# Patient Record
Sex: Male | Born: 2000 | Race: White | Hispanic: No | Marital: Single | Smoking: Current every day smoker
Health system: Southern US, Community
[De-identification: ages and names within clinical notes are randomized; demographics above are authoritative.]

## PROBLEM LIST (undated history)

## (undated) DIAGNOSIS — J45909 Unspecified asthma, uncomplicated: Secondary | ICD-10-CM

## (undated) DIAGNOSIS — Z9109 Other allergy status, other than to drugs and biological substances: Secondary | ICD-10-CM

## (undated) DIAGNOSIS — F419 Anxiety disorder, unspecified: Secondary | ICD-10-CM

## (undated) DIAGNOSIS — T7840XA Allergy, unspecified, initial encounter: Secondary | ICD-10-CM

## (undated) HISTORY — DX: Allergy, unspecified, initial encounter: T78.40XA

## (undated) HISTORY — DX: Unspecified asthma, uncomplicated: J45.909

## (undated) HISTORY — DX: Anxiety disorder, unspecified: F41.9

## (undated) HISTORY — PX: ADENOIDECTOMY: SUR15

## (undated) HISTORY — PX: TONSILLECTOMY: SUR1361

---
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
## 2019-04-02 DIAGNOSIS — U071 COVID-19: Secondary | ICD-10-CM | POA: Diagnosis not present

## 2019-04-02 DIAGNOSIS — Z20828 Contact with and (suspected) exposure to other viral communicable diseases: Secondary | ICD-10-CM | POA: Diagnosis not present

## 2019-08-10 DIAGNOSIS — Z23 Encounter for immunization: Secondary | ICD-10-CM | POA: Diagnosis not present

## 2019-08-15 DIAGNOSIS — R002 Palpitations: Secondary | ICD-10-CM | POA: Diagnosis not present

## 2019-10-04 ENCOUNTER — Ambulatory Visit: Payer: 59 | Admitting: Medical

## 2019-10-06 DIAGNOSIS — Z0000 Encounter for general adult medical examination without abnormal findings: Secondary | ICD-10-CM | POA: Diagnosis not present

## 2019-12-28 DIAGNOSIS — Z23 Encounter for immunization: Secondary | ICD-10-CM | POA: Diagnosis not present

## 2020-03-29 DIAGNOSIS — Z20822 Contact with and (suspected) exposure to covid-19: Secondary | ICD-10-CM | POA: Diagnosis not present

## 2020-03-29 DIAGNOSIS — R0981 Nasal congestion: Secondary | ICD-10-CM | POA: Diagnosis not present

## 2020-03-29 DIAGNOSIS — J069 Acute upper respiratory infection, unspecified: Secondary | ICD-10-CM | POA: Diagnosis not present

## 2020-05-05 DIAGNOSIS — R519 Headache, unspecified: Secondary | ICD-10-CM | POA: Diagnosis not present

## 2020-05-05 DIAGNOSIS — Z20828 Contact with and (suspected) exposure to other viral communicable diseases: Secondary | ICD-10-CM | POA: Diagnosis not present

## 2020-08-29 DIAGNOSIS — F4011 Social phobia, generalized: Secondary | ICD-10-CM | POA: Diagnosis not present

## 2020-09-04 DIAGNOSIS — L209 Atopic dermatitis, unspecified: Secondary | ICD-10-CM | POA: Diagnosis not present

## 2020-09-04 DIAGNOSIS — L219 Seborrheic dermatitis, unspecified: Secondary | ICD-10-CM | POA: Diagnosis not present

## 2020-09-19 DIAGNOSIS — F4011 Social phobia, generalized: Secondary | ICD-10-CM | POA: Diagnosis not present

## 2020-10-10 DIAGNOSIS — F4011 Social phobia, generalized: Secondary | ICD-10-CM | POA: Diagnosis not present

## 2020-10-31 DIAGNOSIS — F4011 Social phobia, generalized: Secondary | ICD-10-CM | POA: Diagnosis not present

## 2020-11-15 DIAGNOSIS — F4011 Social phobia, generalized: Secondary | ICD-10-CM | POA: Diagnosis not present

## 2020-12-07 DIAGNOSIS — F4011 Social phobia, generalized: Secondary | ICD-10-CM | POA: Diagnosis not present

## 2020-12-25 DIAGNOSIS — F4011 Social phobia, generalized: Secondary | ICD-10-CM | POA: Diagnosis not present

## 2020-12-26 DIAGNOSIS — Z23 Encounter for immunization: Secondary | ICD-10-CM | POA: Diagnosis not present

## 2021-01-24 DIAGNOSIS — F4011 Social phobia, generalized: Secondary | ICD-10-CM | POA: Diagnosis not present

## 2021-02-14 DIAGNOSIS — F4011 Social phobia, generalized: Secondary | ICD-10-CM | POA: Diagnosis not present

## 2021-03-07 DIAGNOSIS — F4011 Social phobia, generalized: Secondary | ICD-10-CM | POA: Diagnosis not present

## 2021-10-11 DIAGNOSIS — D229 Melanocytic nevi, unspecified: Secondary | ICD-10-CM | POA: Diagnosis not present

## 2021-10-11 DIAGNOSIS — Z720 Tobacco use: Secondary | ICD-10-CM | POA: Diagnosis not present

## 2021-10-11 DIAGNOSIS — E6609 Other obesity due to excess calories: Secondary | ICD-10-CM | POA: Diagnosis not present

## 2021-10-11 DIAGNOSIS — Z13220 Encounter for screening for lipoid disorders: Secondary | ICD-10-CM | POA: Diagnosis not present

## 2021-10-11 DIAGNOSIS — J310 Chronic rhinitis: Secondary | ICD-10-CM | POA: Diagnosis not present

## 2021-10-11 DIAGNOSIS — Z13228 Encounter for screening for other metabolic disorders: Secondary | ICD-10-CM | POA: Diagnosis not present

## 2021-10-11 DIAGNOSIS — Z130 Encounter for screening for diseases of the blood and blood-forming organs and certain disorders involving the immune mechanism: Secondary | ICD-10-CM | POA: Diagnosis not present

## 2021-10-11 DIAGNOSIS — Z6830 Body mass index (BMI) 30.0-30.9, adult: Secondary | ICD-10-CM | POA: Diagnosis not present

## 2021-10-11 DIAGNOSIS — Z0000 Encounter for general adult medical examination without abnormal findings: Secondary | ICD-10-CM | POA: Diagnosis not present

## 2021-10-11 DIAGNOSIS — Z23 Encounter for immunization: Secondary | ICD-10-CM | POA: Diagnosis not present

## 2021-12-24 DIAGNOSIS — Z23 Encounter for immunization: Secondary | ICD-10-CM | POA: Diagnosis not present

## 2022-02-20 DIAGNOSIS — D225 Melanocytic nevi of trunk: Secondary | ICD-10-CM | POA: Diagnosis not present

## 2022-11-27 DIAGNOSIS — E6609 Other obesity due to excess calories: Secondary | ICD-10-CM | POA: Diagnosis not present

## 2022-11-27 DIAGNOSIS — Z0000 Encounter for general adult medical examination without abnormal findings: Secondary | ICD-10-CM | POA: Diagnosis not present

## 2022-11-27 DIAGNOSIS — Z6831 Body mass index (BMI) 31.0-31.9, adult: Secondary | ICD-10-CM | POA: Diagnosis not present

## 2022-11-27 DIAGNOSIS — J310 Chronic rhinitis: Secondary | ICD-10-CM | POA: Diagnosis not present

## 2022-12-23 DIAGNOSIS — Z23 Encounter for immunization: Secondary | ICD-10-CM | POA: Diagnosis not present

## 2023-06-12 ENCOUNTER — Ambulatory Visit (HOSPITAL_BASED_OUTPATIENT_CLINIC_OR_DEPARTMENT_OTHER)
Admission: EM | Admit: 2023-06-12 | Discharge: 2023-06-12 | Disposition: A | Discharge status: Home / Self Care | Attending: Family Medicine | Admitting: Family Medicine

## 2023-06-12 ENCOUNTER — Encounter (HOSPITAL_BASED_OUTPATIENT_CLINIC_OR_DEPARTMENT_OTHER): Payer: Self-pay | Admitting: *Deleted

## 2023-06-12 ENCOUNTER — Ambulatory Visit (HOSPITAL_BASED_OUTPATIENT_CLINIC_OR_DEPARTMENT_OTHER): Admitting: Radiology

## 2023-06-12 ENCOUNTER — Ambulatory Visit: Payer: Self-pay

## 2023-06-12 ENCOUNTER — Other Ambulatory Visit (HOSPITAL_BASED_OUTPATIENT_CLINIC_OR_DEPARTMENT_OTHER): Payer: Self-pay

## 2023-06-12 DIAGNOSIS — R0602 Shortness of breath: Secondary | ICD-10-CM

## 2023-06-12 DIAGNOSIS — R0789 Other chest pain: Secondary | ICD-10-CM

## 2023-06-12 MED ORDER — ALBUTEROL SULFATE HFA 108 (90 BASE) MCG/ACT IN AERS
1.0000 | INHALATION_SPRAY | Freq: Four times a day (QID) | RESPIRATORY_TRACT | 1 refills | Status: DC | PRN
  Filled 2023-06-12: qty 6.7, 25d supply, fill #0

## 2023-06-12 NOTE — Telephone Encounter (Signed)
 Patient has been scheduled for a new patient appointment at [facility] per Mom's request. Patient was already triaged. Copied from CRM [id number]. Topic: Clinical - Red Word Triage >> Jun 12, 2023 10:20 AM Antwanette L wrote: Red Word that prompted transfer to Nurse Triage: hard to breathe and chest tightness

## 2023-06-12 NOTE — Telephone Encounter (Signed)
 Copied from CRM [id number]. Topic: Clinical - Red Word Triage >> Jun 12, 2023  8:05 AM Elle L wrote: Red Word that prompted transfer to Nurse Triage: The patient's Mom, Woody Kronberg, was calling to schedule the patient at [facility]. However, she advised me that the patient is asthmatic and has been having tightness in his chest and difficulty breathing and he does not have an inhaler.  No acute needs at this time. Would like to establish with [facility] clinic.  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  Office to follow up.

## 2023-06-12 NOTE — ED Provider Notes (Signed)
 [facility]    CSN: [id number] Arrival date & time: 06/12/23  1124      History   Chief Complaint Chief Complaint  Patient presents with   Shortness of Breath    HPI Jonathon Kramer is a 23 y.o. male.   Patient is a 23 year old male that presents today with shortness of breath, chest tightness for about a week.  Reports waxing and waning.  Some mild congestion.  No fevers or chills.  Reports feels similar to when he had asthma attacks as a kid.  He has been taking his allergy medication.  Does have severe allergies.   Shortness of Breath   Past Medical History:  Diagnosis Date   Environmental allergies     There are no active problems to display for this patient.   Past Surgical History:  Procedure Laterality Date   ADENOIDECTOMY     TONSILLECTOMY         Home Medications    Prior to Admission medications   Medication Sig Start Date End Date Taking? Authorizing Provider  albuterol (VENTOLIN HFA) 108 (90 Base) MCG/ACT inhaler Inhale 1-2 puffs into the lungs every 6 (six) hours as needed for wheezing or shortness of breath. 06/12/23  Yes Liylah Najarro A, FNP  Cetirizine HCl (ZYRTEC ALLERGY PO) Take 10 mg by mouth daily.    [provider]    Family History Family History  Problem Relation Age of Onset   Eating disorder Brother     Social History Social History   Tobacco Use   Smoking status: Never  Vaping Use   Vaping status: Every Day  Substance Use Topics   Alcohol use: No   Drug use: No     Allergies   Patient has no known allergies.   Review of Systems Review of Systems  Respiratory:  Positive for chest tightness and shortness of breath.      Physical Exam Triage Vital Signs ED Triage Vitals  Encounter Vitals Group     BP 06/12/23 1204 131/86     Systolic BP Percentile --      Diastolic BP Percentile --      Pulse Rate 06/12/23 1204 69     Resp 06/12/23 1204 18     Temp 06/12/23 1204 98.2 F (36.8 C)     Temp  Source 06/12/23 1204 Oral     SpO2 06/12/23 1204 95 %     Weight 06/12/23 1202 210 lb (95.3 kg)     Height 06/12/23 1202 5\' 10"  (1.778 m)     Head Circumference --      Peak Flow --      Pain Score 06/12/23 1202 0     Pain Loc --      Pain Education --      Exclude from Growth Chart --    No data found.  Updated Vital Signs BP 131/86 (BP Location: Right Arm)   Pulse 69   Temp 98.2 F (36.8 C) (Oral)   Resp 18   Ht 5\' 10"  (1.778 m)   Wt 210 lb (95.3 kg)   SpO2 95%   BMI 30.13 kg/m   Visual Acuity Right Eye Distance:   Left Eye Distance:   Bilateral Distance:    Right Eye Near:   Left Eye Near:    Bilateral Near:     Physical Exam Constitutional:      General: He is not in acute distress.    Appearance: He is well-developed.  He is not ill-appearing, toxic-appearing or diaphoretic.  HENT:     Mouth/Throat:     Pharynx: Oropharynx is clear.  Eyes:     Conjunctiva/sclera: Conjunctivae normal.  Cardiovascular:     Rate and Rhythm: Normal rate and regular rhythm.     Heart sounds: Normal heart sounds.  Pulmonary:     Effort: Pulmonary effort is normal.     Breath sounds: No decreased breath sounds.  Musculoskeletal:        General: No swelling, deformity or signs of injury. Normal range of motion.  Skin:    General: Skin is warm and dry.  Neurological:     Mental Status: He is alert.  Psychiatric:        Mood and Affect: Mood normal.      UC Treatments / Results  Labs (all labs ordered are listed, but only abnormal results are displayed) Labs Reviewed - No data to display  EKG   Radiology DG Chest 2 View Result Date: 06/12/2023 CLINICAL DATA:  Chest tightness and shortness of breath for the past week. EXAM: CHEST - 2 VIEW COMPARISON:  Chest x-ray dated January 04, 2012. FINDINGS: The heart size and mediastinal contours are within normal limits. Both lungs are clear. The visualized skeletal structures are unremarkable. IMPRESSION: No active  cardiopulmonary disease. Electronically Signed   By: Obie Dredge M.D.   On: 06/12/2023 13:28    Procedures Procedures (including critical care time)  Medications Ordered in UC Medications - No data to display  Initial Impression / Assessment and Plan / UC Course  I have reviewed the triage vital signs and the nursing notes.  Pertinent labs & imaging results that were available during my care of the patient were reviewed by me and considered in my medical decision making (see chart for details).     Shortness of breath-believe may be due to allergies.  Chest x-ray was completely normal.  No other concerns on exam. Gave albuterol inhaler to use as needed for cough, wheezing or shortness of breath. Recommended if symptoms continue to follow-up with his doctor. Final Clinical Impressions(s) / UC Diagnoses   Final diagnoses:  Shortness of breath     Discharge Instructions      Continue your allergy medication. I am giving you an inhaler to use for cough, wheezing shortness of breath.  Follow up with your doctor as needed     ED Prescriptions     Medication Sig Dispense Auth. Provider   albuterol (VENTOLIN HFA) 108 (90 Base) MCG/ACT inhaler Inhale 1-2 puffs into the lungs every 6 (six) hours as needed for wheezing or shortness of breath. 6.7 g Janace Aris, FNP      PDMP not reviewed this encounter.   Janace Aris, FNP 06/12/23 1534

## 2023-06-12 NOTE — ED Triage Notes (Signed)
 Patient states shortness of breath/chest tightness for about 1 week without other symptoms.

## 2023-06-12 NOTE — Discharge Instructions (Signed)
 Continue your allergy medication. I am giving you an inhaler to use for cough, wheezing shortness of breath.  Follow up with your doctor as needed

## 2023-06-18 ENCOUNTER — Encounter (HOSPITAL_BASED_OUTPATIENT_CLINIC_OR_DEPARTMENT_OTHER): Payer: Self-pay | Admitting: Student

## 2023-06-18 ENCOUNTER — Ambulatory Visit (HOSPITAL_BASED_OUTPATIENT_CLINIC_OR_DEPARTMENT_OTHER): Admitting: Student

## 2023-06-18 VITALS — BP 134/77 | HR 79 | Temp 97.8°F | Resp 16 | Ht 68.9 in | Wt 218.0 lb

## 2023-06-18 DIAGNOSIS — Z6832 Body mass index (BMI) 32.0-32.9, adult: Secondary | ICD-10-CM

## 2023-06-18 DIAGNOSIS — Z7689 Persons encountering health services in other specified circumstances: Secondary | ICD-10-CM

## 2023-06-18 DIAGNOSIS — J4530 Mild persistent asthma, uncomplicated: Secondary | ICD-10-CM | POA: Insufficient documentation

## 2023-06-18 DIAGNOSIS — J302 Other seasonal allergic rhinitis: Secondary | ICD-10-CM | POA: Insufficient documentation

## 2023-06-18 DIAGNOSIS — L309 Dermatitis, unspecified: Secondary | ICD-10-CM | POA: Insufficient documentation

## 2023-06-18 DIAGNOSIS — E6609 Other obesity due to excess calories: Secondary | ICD-10-CM

## 2023-06-18 DIAGNOSIS — Z720 Tobacco use: Secondary | ICD-10-CM | POA: Insufficient documentation

## 2023-06-18 DIAGNOSIS — E66811 Obesity, class 1: Secondary | ICD-10-CM | POA: Insufficient documentation

## 2023-06-18 NOTE — Assessment & Plan Note (Addendum)
 Stable. Severe seasonal allergies managed with daily Zyrtec. Symptoms improve with Zyrtec, no preference for switching antihistamines. - Continue daily Zyrtec

## 2023-06-18 NOTE — Assessment & Plan Note (Addendum)
 Stable. Intermittent shortness of breath and chest tightness last week, likely exacerbated by seasonal allergies. Symptoms improved with albuterol inhaler. No current wheezing or chest tightness. Lung exam normal, chest X-ray clear. No need for maintenance inhaler unless symptoms persist outside of spring. - Continue albuterol inhaler as needed - Consider daily inhaler if symptoms persist outside of spring - May consider Montelukast at future visit.

## 2023-06-18 NOTE — Assessment & Plan Note (Signed)
 Daily vaping habit acknowledged. Desires to quit but does not feel it impacts current respiratory symptoms. Discussed long-term risks of vaping, focusing on potential future health impacts. - Discuss smoking cessation options if interest in quitting is expressed - Educated. Does not desire to quit today.

## 2023-06-18 NOTE — Assessment & Plan Note (Addendum)
 No current plaques noted on examination. Continue to follow with dermatology. Stable- Continue prn topical cream as needed.

## 2023-06-18 NOTE — Patient Instructions (Signed)
 It was nice to see you today!  As we discussed in clinic please continue your albuterol inhaler as needed. If you are needing it several times per day for the next couple of months, I would encourage you to let me know so that we can get you on a maintenance inhaler.  If you have any problems before your next visit feel free to message me via MyChart (minor issues or questions) or call the office, otherwise you may reach out to schedule an office visit.  Thank you! Gerilyn Pilgrim Cephas Revard, PA-C

## 2023-06-18 NOTE — Progress Notes (Signed)
 New Patient Office Visit  Subjective    Patient ID: Jonathon Kramer, male    DOB: 10/08/2000  Age: 23 y.o. MRN: [id number]  CC:  Chief Complaint  Patient presents with   Establish Care    Here to establish care.   Shortness of Breath    Had to go to UC last week and was having trouble breathing. Has hx of asthma, was more prevalent as a child. Last week had similar sxs.    Discussed the use of AI scribe software for clinical note transcription with the patient, who gave verbal consent to proceed.  History of Present Illness   Jonathon Kramer is a 23 year old male with asthma and severe allergies who presents with shortness of breath.  He has experienced shortness of breath for the past week, accompanied by a sensation of chest tightness that worsened over time, affecting his sleep, particularly upon waking. The symptoms have improved with the use of an albuterol inhaler as prescribed, and he no longer feels chest pain this week. No current wheezing is noted, but he mentions a reduced lung capacity that has improved. A recent chest X-ray was clear.  I have reviewed the real images of the chest xray from 06/12/23 and agree with the interpretation. No cardiopulmonary disease.  He has a history of severe allergies, particularly to seasonal changes, and takes Zyrtec daily, which he finds effective. He notes an immediate difference if he misses a dose and suspects his recent symptoms may be allergy-related, similar to past experiences with asthma during childhood.  He has a history of eczema, primarily as a child, with occasional facial outbreaks managed with a topical cream. No recent eczema flare-ups except for occasional facial outbreaks. Follows with Dermatology in [place].  He vapes daily but does not believe it impacts his breathing. He wants to quit vaping eventually but has not made any attempts yet.  His family history is notable for his mother having a heart condition, possibly heart  failure, and a heart attack in her late thirties or early forties. Discussed possible screening exams in the future to assure cardiovascular health.        06/18/2023    1:13 PM  GAD 7 : Generalized Anxiety Score  Nervous, Anxious, on Edge 1  Control/stop worrying 1  Worry too much - different things 2  Trouble relaxing 1  Restless 0  Easily annoyed or irritable 1  Afraid - awful might happen 0  Total GAD 7 Score 6  Anxiety Difficulty Not difficult at all      06/18/2023    1:13 PM  PHQ9 SCORE ONLY  PHQ-9 Total Score 11   Screenings:  Colon Cancer: not due Lung Cancer: vapes nicotine. Denies help with quitting. Diabetes: no pmh  HLD: no pmh  Hep B Vax: done  Outpatient Encounter Medications as of 06/18/2023  Medication Sig   albuterol (VENTOLIN HFA) 108 (90 Base) MCG/ACT inhaler Inhale 1-2 puffs into the lungs every 6 (six) hours as needed for wheezing or shortness of breath.   Cetirizine HCl (ZYRTEC ALLERGY PO) Take 10 mg by mouth daily.   No facility-administered encounter medications on file as of 06/18/2023.    Past Medical History:  Diagnosis Date   Asthma    Environmental allergies     Past Surgical History:  Procedure Laterality Date   ADENOIDECTOMY     TONSILLECTOMY      Family History  Problem Relation Age of Onset  Heart disease Mother        heart condition    Social History   Socioeconomic History   Marital status: Single    Spouse name: Not on file   Number of children: 0   Years of education: Not on file   Highest education level: Not on file  Occupational History   Not on file  Tobacco Use   Smoking status: Never    Passive exposure: Never   Smokeless tobacco: Never  Vaping Use   Vaping status: Every Day   Substances: Nicotine  Substance and Sexual Activity   Alcohol use: Yes    Comment: occasionally   Drug use: No   Sexual activity: Not on file  Other Topics Concern   Not on file  Social History Narrative   Not on file    Social Drivers of Health   Financial Resource Strain: Not on file  Food Insecurity: No Food Insecurity (06/18/2023)   Hunger Vital Sign    Worried About Running Out of Food in the Last Year: Never true    Ran Out of Food in the Last Year: Never true  Transportation Needs: No Transportation Needs (06/18/2023)   PRAPARE - Administrator, Civil Service (Medical): No    Lack of Transportation (Non-Medical): No  Physical Activity: Not on file  Stress: Not on file  Social Connections: Not on file  Intimate Partner Violence: Not At Risk (06/18/2023)   Humiliation, Afraid, Rape, and Kick questionnaire    Fear of Current or Ex-Partner: No    Emotionally Abused: No    Physically Abused: No    Sexually Abused: No    ROS  Per HPI      Objective    BP 134/77   Pulse 79   Temp 97.8 F (36.6 C) (Oral)   Resp 16   Ht 5' 8.9" (1.75 m)   Wt 218 lb (98.9 kg)   SpO2 97%   BMI 32.29 kg/m   Physical Exam Constitutional:      General: He is not in acute distress.    Appearance: Normal appearance. He is not ill-appearing.  HENT:     Head: Normocephalic and atraumatic.     Right Ear: External ear normal.     Left Ear: External ear normal.     Nose: Nose normal.     Mouth/Throat:     Mouth: Mucous membranes are moist.     Pharynx: Oropharynx is clear.  Eyes:     General: No scleral icterus.    Extraocular Movements: Extraocular movements intact.     Conjunctiva/sclera: Conjunctivae normal.     Pupils: Pupils are equal, round, and reactive to light.  Neck:     Vascular: No carotid bruit.  Cardiovascular:     Rate and Rhythm: Normal rate and regular rhythm.     Pulses: Normal pulses.     Heart sounds: Normal heart sounds. No murmur heard.    No friction rub.  Pulmonary:     Effort: Pulmonary effort is normal. No respiratory distress.     Breath sounds: Normal breath sounds. No wheezing, rhonchi or rales.  Musculoskeletal:        General: Normal range of motion.      Cervical back: Neck supple.     Right lower leg: No edema.     Left lower leg: No edema.  Skin:    General: Skin is warm and dry.     Coloration: Skin is not  jaundiced or pale.  Neurological:     General: No focal deficit present.     Mental Status: He is alert.  Psychiatric:        Mood and Affect: Mood normal.        Behavior: Behavior normal.         Assessment & Plan:   Encounter to establish care  Vapes nicotine containing substance Assessment & Plan: Daily vaping habit acknowledged. Desires to quit but does not feel it impacts current respiratory symptoms. Discussed long-term risks of vaping, focusing on potential future health impacts. - Discuss smoking cessation options if interest in quitting is expressed - Educated. Does not desire to quit today.   Mild persistent asthma without complication Assessment & Plan: Intermittent shortness of breath and chest tightness last week, likely exacerbated by seasonal allergies. Symptoms improved with albuterol inhaler. No current wheezing or chest tightness. Lung exam normal, chest X-ray clear. No need for maintenance inhaler unless symptoms persist outside of spring. - Continue albuterol inhaler as needed - Consider daily inhaler if symptoms persist outside of spring - May consider Montelukast at future visit.   Class 1 obesity due to excess calories without serious comorbidity with body mass index (BMI) of 32.0 to 32.9 in adult Assessment & Plan: BMI today of 32.29. Currently a full time student which may contribute.   Seasonal allergies Assessment & Plan: Severe seasonal allergies managed with daily Zyrtec. Symptoms improve with Zyrtec, no preference for switching antihistamines. - Continue daily Zyrtec   Eczema, unspecified type Assessment & Plan: No current plaques noted on examination. Continue to follow with dermatology. Continue prn topical cream as needed.   General Health Maintenance No diabetes or  hyperlipidemia. Previous physical exam in August 2024 without blood work. Family history of early heart disease noted. Discussed potential future screenings (in the next 15-20 years) for heart health, including LP(a), ApoB, and coronary artery calcium scan, given family history of early heart attack. - Schedule next physical exam for August 2025 with blood work - Consider future cholesterol screenings and coronary artery calcium scan (consider in age 85s) based on family history     I spent 45 minutes reviewing charts from multiple sources and specialists, review of recent labs and imaging studies, discussing both acute and chronic conditions with patient, and making changes to patients prescription management. Has seen urgent care- is technically an established patient.  Return in about 5 months (around 11/03/2023) for Annual Physical.   Teryl Lucy Lorie Cleckley, PA-C

## 2023-06-18 NOTE — Assessment & Plan Note (Signed)
 BMI today of 32.29. Currently a full time student which may contribute.

## 2023-11-03 ENCOUNTER — Encounter (HOSPITAL_BASED_OUTPATIENT_CLINIC_OR_DEPARTMENT_OTHER): Payer: Self-pay | Admitting: Student

## 2023-11-07 DIAGNOSIS — D225 Melanocytic nevi of trunk: Secondary | ICD-10-CM | POA: Diagnosis not present

## 2023-11-28 ENCOUNTER — Encounter (HOSPITAL_BASED_OUTPATIENT_CLINIC_OR_DEPARTMENT_OTHER): Payer: Self-pay | Admitting: Emergency Medicine

## 2023-11-28 ENCOUNTER — Ambulatory Visit (HOSPITAL_BASED_OUTPATIENT_CLINIC_OR_DEPARTMENT_OTHER)
Admission: EM | Admit: 2023-11-28 | Discharge: 2023-11-28 | Disposition: A | Discharge status: Home / Self Care | Attending: Family Medicine | Admitting: Family Medicine

## 2023-11-28 ENCOUNTER — Other Ambulatory Visit (HOSPITAL_BASED_OUTPATIENT_CLINIC_OR_DEPARTMENT_OTHER): Payer: Self-pay

## 2023-11-28 DIAGNOSIS — H6123 Impacted cerumen, bilateral: Secondary | ICD-10-CM | POA: Diagnosis not present

## 2023-11-28 DIAGNOSIS — H6122 Impacted cerumen, left ear: Secondary | ICD-10-CM

## 2023-11-28 DIAGNOSIS — H66002 Acute suppurative otitis media without spontaneous rupture of ear drum, left ear: Secondary | ICD-10-CM | POA: Diagnosis not present

## 2023-11-28 MED ORDER — CEFDINIR 300 MG PO CAPS
300.0000 mg | ORAL_CAPSULE | Freq: Two times a day (BID) | ORAL | 0 refills | Status: AC
  Filled 2023-11-28: qty 20, 10d supply, fill #0

## 2023-11-28 NOTE — ED Triage Notes (Signed)
 Pt c/o left ear fullness on and off for about 3 days and he can barely hear anything out of it this morning. Pt has a history of wax build up in the past.

## 2023-11-28 NOTE — ED Provider Notes (Signed)
 [facility]    CSN: [id number] Arrival date & time: 11/28/23  1013      History   Chief Complaint Chief Complaint  Patient presents with   Ear Fullness    HPI Jonathon Kramer is a 23 y.o. male.   23 year old male with report of chronic wax in his ears and sometimes having blockages due to wax.  He has had left ear fullness off-and-on for 3 days now he cannot hear out of his left ear at all.   Ear Fullness Pertinent negatives include no chest pain and no abdominal pain.    Past Medical History:  Diagnosis Date   Asthma    Environmental allergies     Patient Active Problem List   Diagnosis Date Noted   Mild persistent asthma without complication 06/18/2023   Vapes nicotine containing substance 06/18/2023   Class 1 obesity due to excess calories without serious comorbidity with body mass index (BMI) of 32.0 to 32.9 in adult 06/18/2023   Seasonal allergies 06/18/2023   Eczema 06/18/2023    Past Surgical History:  Procedure Laterality Date   ADENOIDECTOMY     TONSILLECTOMY         Home Medications    Prior to Admission medications   Medication Sig Start Date End Date Taking? Authorizing Provider  cefdinir  (OMNICEF ) 300 MG capsule Take 1 capsule (300 mg total) by mouth 2 (two) times daily for 10 days. 11/28/23 12/08/23 Yes Ival Domino, FNP  Cetirizine HCl (ZYRTEC ALLERGY PO) Take 10 mg by mouth daily.   Yes [provider]  albuterol  (VENTOLIN  HFA) 108 (90 Base) MCG/ACT inhaler Inhale 1-2 puffs into the lungs every 6 (six) hours as needed for wheezing or shortness of breath. 06/12/23   Adah Wilbert LABOR, FNP    Family History Family History  Problem Relation Age of Onset   Heart disease Mother        heart condition    Social History Social History   Tobacco Use   Smoking status: Never    Passive exposure: Never   Smokeless tobacco: Never  Vaping Use   Vaping status: Every Day   Substances: Nicotine  Substance Use Topics   Alcohol use:  Yes    Comment: occasionally   Drug use: No     Allergies   Patient has no known allergies.   Review of Systems Review of Systems  Constitutional:  Negative for chills and fever.  HENT:  Positive for ear discharge and ear pain. Negative for sore throat.   Eyes:  Negative for pain and visual disturbance.  Respiratory:  Negative for cough.   Cardiovascular:  Negative for chest pain and palpitations.  Gastrointestinal:  Negative for abdominal pain, constipation, diarrhea, nausea and vomiting.  Genitourinary:  Negative for dysuria and hematuria.  Musculoskeletal:  Negative for arthralgias and back pain.  Skin:  Negative for color change and rash.  Neurological:  Negative for seizures and syncope.  All other systems reviewed and are negative.    Physical Exam Triage Vital Signs ED Triage Vitals  Encounter Vitals Group     BP 11/28/23 1115 121/85     Girls Systolic BP Percentile --      Girls Diastolic BP Percentile --      Boys Systolic BP Percentile --      Boys Diastolic BP Percentile --      Pulse Rate 11/28/23 1115 89     Resp 11/28/23 1115 18     Temp 11/28/23  1115 99 F (37.2 C)     Temp Source 11/28/23 1115 Oral     SpO2 11/28/23 1115 96 %     Weight --      Height --      Head Circumference --      Peak Flow --      Pain Score 11/28/23 1114 0     Pain Loc --      Pain Education --      Exclude from Growth Chart --    No data found.  Updated Vital Signs BP 121/85 (BP Location: Right Arm)   Pulse 89   Temp 99 F (37.2 C) (Oral)   Resp 18   SpO2 96%   Visual Acuity Right Eye Distance:   Left Eye Distance:   Bilateral Distance:    Right Eye Near:   Left Eye Near:    Bilateral Near:     Physical Exam Vitals and nursing note reviewed.  Constitutional:      General: He is not in acute distress.    Appearance: He is well-developed. He is not ill-appearing or toxic-appearing.  HENT:     Head: Normocephalic and atraumatic.     Right Ear: Hearing,  tympanic membrane and external ear normal. There is impacted cerumen (Moderate wax in canal.  Tympanic membrane is visible and normal.).     Left Ear: Hearing and external ear normal. There is impacted cerumen (Wax is completely obstructive of the canal and the tympanic membrane is not visible.).     Ears:     Comments: Reassessment after ear lavage: Ears completely cleared of wax.  Left ear shows an erythematous and bulging tympanic membrane.    Nose: No congestion or rhinorrhea.     Right Sinus: No maxillary sinus tenderness or frontal sinus tenderness.     Left Sinus: No maxillary sinus tenderness or frontal sinus tenderness.     Mouth/Throat:     Lips: Pink.     Mouth: Mucous membranes are moist.     Pharynx: Uvula midline. No oropharyngeal exudate or posterior oropharyngeal erythema.     Tonsils: No tonsillar exudate.  Eyes:     Conjunctiva/sclera: Conjunctivae normal.     Pupils: Pupils are equal, round, and reactive to light.  Cardiovascular:     Rate and Rhythm: Normal rate and regular rhythm.     Heart sounds: S1 normal and S2 normal. No murmur heard. Pulmonary:     Effort: Pulmonary effort is normal. No respiratory distress.     Breath sounds: Normal breath sounds. No decreased breath sounds, wheezing, rhonchi or rales.  Abdominal:     General: Bowel sounds are normal.     Palpations: Abdomen is soft.     Tenderness: There is no abdominal tenderness.  Musculoskeletal:        General: No swelling.     Cervical back: Neck supple.  Lymphadenopathy:     Head:     Right side of head: No submental, submandibular, tonsillar, preauricular or posterior auricular adenopathy.     Left side of head: No submental, submandibular, tonsillar, preauricular or posterior auricular adenopathy.     Cervical: No cervical adenopathy.     Right cervical: No superficial cervical adenopathy.    Left cervical: No superficial cervical adenopathy.  Skin:    General: Skin is warm and dry.      Capillary Refill: Capillary refill takes less than 2 seconds.     Findings: No rash.  Neurological:  Mental Status: He is alert and oriented to person, place, and time.  Psychiatric:        Mood and Affect: Mood normal.   Left ear infection   UC Treatments / Results  Labs (all labs ordered are listed, but only abnormal results are displayed) Labs Reviewed - No data to display  EKG   Radiology No results found.  Procedures Procedures (including critical care time)  Medications Ordered in UC Medications - No data to display  Initial Impression / Assessment and Plan / UC Course  I have reviewed the triage vital signs and the nursing notes.  Pertinent labs & imaging results that were available during my care of the patient were reviewed by me and considered in my medical decision making (see chart for details).  Plan of Care: Left otitis media: Cefdinir  300 mg twice daily for 10 days.  Get plenty of fluids and rest.  Wax impaction of both ears and hearing loss on the left with ear pain on the left: Ear lavage was usually successful and ears are clear but left ear does show ear infection.  Hearing should improve once the ear infection improves and that should also resolve the ear pain.  Work excuse provided, if needed.  Follow-up as needed.  I reviewed the plan of care with the patient and/or the patient's guardian.  The patient and/or guardian had time to ask questions and acknowledged that the questions were answered.  I provided instruction on symptoms or reasons to return here or to go to an ER, if symptoms/condition did not improve, worsened or if new symptoms occurred.  Final Clinical Impressions(s) / UC Diagnoses   Final diagnoses:  Bilateral impacted cerumen  Hearing loss of left ear due to cerumen impaction  Non-recurrent acute suppurative otitis media of left ear without spontaneous rupture of tympanic membrane     Discharge Instructions      Left ear  infection: Cefdinir , 300 mg, twice daily for 10 days.  Get plenty of fluids and rest.  Make an appointment to see primary care in 3 weeks for recheck of left ear.  Bilateral cerumen impaction and decreased hearing in left ear secondary to cerumen impaction: Ear lavage completely cleared all the wax.  Patient did have an obstructive cerumen impaction on the left but he also has left otitis media.  Hearing should improve once the ear infection has improved.  Follow-up here as needed.  Encouraged to make an appointment with primary care for further evaluation of ears in 3 weeks.     ED Prescriptions     Medication Sig Dispense Auth. Provider   cefdinir  (OMNICEF ) 300 MG capsule Take 1 capsule (300 mg total) by mouth 2 (two) times daily for 10 days. 20 capsule Ival Domino, FNP      PDMP not reviewed this encounter.   Ival Domino, FNP 11/28/23 1226

## 2023-11-28 NOTE — Discharge Instructions (Signed)
 Left ear infection: Cefdinir , 300 mg, twice daily for 10 days.  Get plenty of fluids and rest.  Make an appointment to see primary care in 3 weeks for recheck of left ear.  Bilateral cerumen impaction and decreased hearing in left ear secondary to cerumen impaction: Ear lavage completely cleared all the wax.  Patient did have an obstructive cerumen impaction on the left but he also has left otitis media.  Hearing should improve once the ear infection has improved.  Follow-up here as needed.  Encouraged to make an appointment with primary care for further evaluation of ears in 3 weeks.

## 2023-12-01 ENCOUNTER — Encounter (HOSPITAL_BASED_OUTPATIENT_CLINIC_OR_DEPARTMENT_OTHER): Admitting: Student

## 2023-12-17 ENCOUNTER — Encounter (HOSPITAL_BASED_OUTPATIENT_CLINIC_OR_DEPARTMENT_OTHER): Payer: Self-pay | Admitting: Student

## 2023-12-17 ENCOUNTER — Encounter (HOSPITAL_BASED_OUTPATIENT_CLINIC_OR_DEPARTMENT_OTHER): Admitting: Student

## 2023-12-30 ENCOUNTER — Ambulatory Visit (INDEPENDENT_AMBULATORY_CARE_PROVIDER_SITE_OTHER): Admitting: Student

## 2023-12-30 VITALS — BP 122/81 | HR 71 | Temp 98.3°F | Resp 16 | Ht 68.9 in | Wt 215.7 lb

## 2023-12-30 DIAGNOSIS — E6609 Other obesity due to excess calories: Secondary | ICD-10-CM

## 2023-12-30 DIAGNOSIS — G479 Sleep disorder, unspecified: Secondary | ICD-10-CM

## 2023-12-30 DIAGNOSIS — G629 Polyneuropathy, unspecified: Secondary | ICD-10-CM

## 2023-12-30 DIAGNOSIS — Z136 Encounter for screening for cardiovascular disorders: Secondary | ICD-10-CM

## 2023-12-30 DIAGNOSIS — Z131 Encounter for screening for diabetes mellitus: Secondary | ICD-10-CM | POA: Diagnosis not present

## 2023-12-30 DIAGNOSIS — Z720 Tobacco use: Secondary | ICD-10-CM | POA: Diagnosis not present

## 2023-12-30 DIAGNOSIS — Z23 Encounter for immunization: Secondary | ICD-10-CM

## 2023-12-30 DIAGNOSIS — E66811 Obesity, class 1: Secondary | ICD-10-CM | POA: Diagnosis not present

## 2023-12-30 DIAGNOSIS — Z13220 Encounter for screening for lipoid disorders: Secondary | ICD-10-CM

## 2023-12-30 DIAGNOSIS — Z6832 Body mass index (BMI) 32.0-32.9, adult: Secondary | ICD-10-CM

## 2023-12-30 DIAGNOSIS — Z0000 Encounter for general adult medical examination without abnormal findings: Secondary | ICD-10-CM | POA: Diagnosis not present

## 2023-12-30 NOTE — Progress Notes (Unsigned)
 Complete physical exam  Patient: Jonathon Kramer   DOB: Nov 18, 2000   23 y.o. Male  MRN: [id number]  Subjective:    Chief Complaint  Patient presents with   Annual Exam    Annual exam. Would like flu shot and labs.    Numbness    Been having right foot toe numbness for the past year after walking for a while or standing around. Got a new job and it is labor intensive and right toes were numb for a week.    Discussed the use of AI scribe software for clinical note transcription with the patient, who gave verbal consent to proceed.  History of Present Illness   Jonathon Kramer is a 23 year old male who presents for his annual exam with numbness in the toes of his right foot.  He experiences numbness in the toes of his right foot, which shifts between different toes and is more noticeable after prolonged standing or physical activity. The numbness initially started with a different pair of shoes, and he has since switched to wearing [employer], which he finds comfortable. There is no associated pain. No numbness in other areas.  He denies any family history of low vitamin B12 levels. His alcohol consumption is limited to about three beers per week, usually on one day. His diet includes tomato juice, orange juice, and a variety of vegetables and fruits. He has not experienced acid reflux from his diet.  His work is physically intensive, which he considers a form of exercise, although he has not engaged in additional exercise routines. He is attempting to adjust his sleep schedule to wake up earlier but sometimes experiences difficulty sleeping due to feeling hot and having racing thoughts at night. Despite this, he generally falls asleep easier due to increased physical activity during the day.  He has a history of ear infections, which have improved after treatment with medication. He denies any current eye or dental issues and receives regular dental care. He continues to vape, although he has  not experienced shortness of breath recently.       Most recent fall risk assessment:     No data to display           Most recent depression screenings:    12/30/2023    8:58 AM 06/18/2023    1:13 PM  PHQ 2/9 Scores  PHQ - 2 Score 0 3  PHQ- 9 Score 4 11    Vision:Not within last year  and Dental: No current dental problems and Receives regular dental care  Patient Active Problem List   Diagnosis Date Noted   Mild persistent asthma without complication 06/18/2023   Vapes nicotine containing substance 06/18/2023   Class 1 obesity due to excess calories without serious comorbidity with body mass index (BMI) of 32.0 to 32.9 in adult 06/18/2023   Seasonal allergies 06/18/2023   Eczema 06/18/2023   Past Medical History:  Diagnosis Date   Allergy    Anxiety    Asthma    Environmental allergies    Social History   Tobacco Use   Smoking status: Every Day    Types: E-cigarettes    Passive exposure: Never   Smokeless tobacco: Never  Vaping Use   Vaping status: Every Day   Substances: Nicotine, THC  Substance Use Topics   Alcohol use: Not Currently    Comment: 3 beers and maybe 1 shot in a week   Drug use: No   No Known  Allergies    Patient Care Team: Welborn Keena T, PA-C as PCP - General (Physician Assistant)   Outpatient Medications Prior to Visit  Medication Sig   Cetirizine HCl (ZYRTEC ALLERGY PO) Take 10 mg by mouth daily.   [DISCONTINUED] albuterol  (VENTOLIN  HFA) 108 (90 Base) MCG/ACT inhaler Inhale 1-2 puffs into the lungs every 6 (six) hours as needed for wheezing or shortness of breath.   No facility-administered medications prior to visit.    ROS  Per HPI     Objective:     BP 122/81   Pulse 71   Temp 98.3 F (36.8 C) (Oral)   Resp 16   Ht 5' 8.9 (1.75 m)   Wt 215 lb 11.2 oz (97.8 kg)   SpO2 98%   BMI 31.95 kg/m  BP Readings from Last 3 Encounters:  12/30/23 122/81  11/28/23 121/85  06/18/23 134/77   Wt Readings from Last 3  Encounters:  12/30/23 215 lb 11.2 oz (97.8 kg)  06/18/23 218 lb (98.9 kg)  06/12/23 210 lb (95.3 kg)      Physical Exam Constitutional:      General: He is not in acute distress.    Appearance: Normal appearance. He is not ill-appearing or diaphoretic.  HENT:     Head: Normocephalic and atraumatic.     Right Ear: Tympanic membrane, ear canal and external ear normal.     Left Ear: Tympanic membrane, ear canal and external ear normal.     Nose: Nose normal.     Mouth/Throat:     Mouth: Mucous membranes are moist.     Pharynx: Oropharynx is clear.  Eyes:     General: No scleral icterus.       Right eye: No discharge.        Left eye: No discharge.     Extraocular Movements: Extraocular movements intact.     Conjunctiva/sclera: Conjunctivae normal.     Pupils: Pupils are equal, round, and reactive to light.  Neck:     Thyroid : No thyroid  mass, thyromegaly or thyroid  tenderness.     Vascular: No carotid bruit.  Cardiovascular:     Rate and Rhythm: Normal rate and regular rhythm.     Pulses: Normal pulses.     Heart sounds: Normal heart sounds. No murmur heard.    No friction rub. No gallop.  Pulmonary:     Effort: Pulmonary effort is normal.     Breath sounds: Wheezing (Slight, barely audible expiratory) present. No rhonchi or rales.  Chest:     Chest wall: No tenderness.  Abdominal:     General: Bowel sounds are normal. There is no distension.     Palpations: Abdomen is soft.     Tenderness: There is no abdominal tenderness. There is no guarding.  Musculoskeletal:        General: No swelling, deformity or signs of injury.     Cervical back: Neck supple.     Right lower leg: No edema.     Left lower leg: No edema.  Lymphadenopathy:     Cervical: No cervical adenopathy.     Right cervical: No superficial or posterior cervical adenopathy.    Left cervical: No superficial cervical adenopathy.  Skin:    Coloration: Skin is not jaundiced.     Findings: No rash.   Neurological:     General: No focal deficit present.     Mental Status: He is alert and oriented to person, place, and time.  Motor: No weakness.     Deep Tendon Reflexes: Reflexes normal.     Comments: Occasional numbness noted in toes 2 through 5. Normal monofilament exam to right foot  Psychiatric:        Behavior: Behavior normal.      Results for orders placed or performed in visit on 12/30/23  CBC with Differential/Platelet  Result Value Ref Range   WBC 5.9 3.4 - 10.8 x10E3/uL   RBC 5.72 4.14 - 5.80 x10E6/uL   Hemoglobin 17.5 13.0 - 17.7 g/dL   Hematocrit 47.8 (H) 62.4 - 51.0 %   MCV 91 79 - 97 fL   MCH 30.6 26.6 - 33.0 pg   MCHC 33.6 31.5 - 35.7 g/dL   RDW 86.7 88.3 - 84.5 %   Platelets 348 150 - 450 x10E3/uL   Neutrophils 55 Not Estab. %   Lymphs 31 Not Estab. %   Monocytes 10 Not Estab. %   Eos 3 Not Estab. %   Basos 1 Not Estab. %   Neutrophils Absolute 3.3 1.4 - 7.0 x10E3/uL   Lymphocytes Absolute 1.8 0.7 - 3.1 x10E3/uL   Monocytes Absolute 0.6 0.1 - 0.9 x10E3/uL   EOS (ABSOLUTE) 0.2 0.0 - 0.4 x10E3/uL   Basophils Absolute 0.0 0.0 - 0.2 x10E3/uL   Immature Granulocytes 0 Not Estab. %   Immature Grans (Abs) 0.0 0.0 - 0.1 x10E3/uL  Comprehensive metabolic panel with GFR  Result Value Ref Range   Glucose 85 70 - 99 mg/dL   BUN 15 6 - 20 mg/dL   Creatinine, Ser 9.11 0.76 - 1.27 mg/dL   eGFR 876 >40 fO/fpw/8.26   BUN/Creatinine Ratio 17 9 - 20   Sodium 138 134 - 144 mmol/L   Potassium 4.4 3.5 - 5.2 mmol/L   Chloride 99 96 - 106 mmol/L   CO2 23 20 - 29 mmol/L   Calcium 10.3 (H) 8.7 - 10.2 mg/dL   Total Protein 7.3 6.0 - 8.5 g/dL   Albumin 5.0 4.3 - 5.2 g/dL   Globulin, Total 2.3 1.5 - 4.5 g/dL   Bilirubin Total 0.8 0.0 - 1.2 mg/dL   Alkaline Phosphatase 48 47 - 123 IU/L   AST 37 0 - 40 IU/L   ALT 45 (H) 0 - 44 IU/L  Lipid panel  Result Value Ref Range   Cholesterol, Total 164 100 - 199 mg/dL   Triglycerides 73 0 - 149 mg/dL   HDL 53 >60 mg/dL    VLDL Cholesterol Cal 14 5 - 40 mg/dL   LDL Chol Calc (NIH) 97 0 - 99 mg/dL   Chol/HDL Ratio 3.1 0.0 - 5.0 ratio  Hemoglobin A1c  Result Value Ref Range   Hgb A1c MFr Bld 5.4 4.8 - 5.6 %   Est. average glucose Bld gHb Est-mCnc 108 mg/dL   Last CBC Lab Results  Component Value Date   WBC 5.9 12/30/2023   HGB 17.5 12/30/2023   HCT 52.1 (H) 12/30/2023   MCV 91 12/30/2023   MCH 30.6 12/30/2023   RDW 13.2 12/30/2023   PLT 348 12/30/2023   Last metabolic panel Lab Results  Component Value Date   GLUCOSE 85 12/30/2023   NA 138 12/30/2023   K 4.4 12/30/2023   CL 99 12/30/2023   CO2 23 12/30/2023   BUN 15 12/30/2023   CREATININE 0.88 12/30/2023   EGFR 123 12/30/2023   CALCIUM 10.3 (H) 12/30/2023   PROT 7.3 12/30/2023   ALBUMIN 5.0 12/30/2023   LABGLOB 2.3 12/30/2023  BILITOT 0.8 12/30/2023   ALKPHOS 48 12/30/2023   AST 37 12/30/2023   ALT 45 (H) 12/30/2023   Last lipids Lab Results  Component Value Date   CHOL 164 12/30/2023   HDL 53 12/30/2023   LDLCALC 97 12/30/2023   TRIG 73 12/30/2023   CHOLHDL 3.1 12/30/2023   Last hemoglobin A1c Lab Results  Component Value Date   HGBA1C 5.4 12/30/2023        Assessment & Plan:    Routine Health Maintenance and Physical Exam  Health Maintenance  Topic Date Due   HPV Vaccine (1 - Male 3-dose series) Never done   HIV Screening  Never done   Hepatitis C Screening  Never done   Pneumococcal Vaccine (1 of 2 - PCV) 10/19/2018   COVID-19 Vaccine (2 - 2025-26 season) 12/29/2024*   DTaP/Tdap/Td vaccine (4 - Td or Tdap) 10/12/2031   Flu Shot  Completed   Meningitis B Vaccine  Completed  *Topic was postponed. The date shown is not the original due date.    Encouraged him to engage in regular exercise appropriate for his age and condition.  Assessment and Plan    Compressive type neuropathy of right foot/toes Intermittent numbness in the toes of the right foot, primarily after prolonged standing or activity, likely due to  compressive neuropathy from increased load and pressure, affecting nerve and blood flow for a short period of time-he notes that this is only intermittent. No pain associated. Differential diagnosis includes vitamin B12 deficiency, but less likely. - Advise wearing comfortable shoes - Recommend avoiding excessive bending at the toes - Suggest using a ladder or stool to avoid prolonged tiptoe standing  Asthma Asthma is well-controlled with no recent episodes of shortness of breath. Slight wheeze on examination, but overall lung sounds are good.  Difficulty sleeping Difficulty with sleep due to racing thoughts and feeling hot at night. Increased physical activity has helped with sleep onset. Discussed non-pharmacological interventions, including CBT-I and magnesium glycinate. - Recommend CBT-I resources for insomnia - Suggest magnesium glycinate 400 mg nightly, 30 minutes before bedtime - I would suggest anxiety treatment or insomnia treatment if the above is not working.  Nicotine dependence, vaping Continued use of vaping with no reduction in frequency. No recent shortness of breath or respiratory issues.  General Health Maintenance Discussion of general health maintenance including vaccinations and screenings. No current eye or dental issues. Regular dental care is maintained. No HPV vaccination received. - Recommend considering HPV vaccination, available at pharmacy or clinic - Order blood work including diabetes screening     Return in about 1 year (around 12/29/2024) for Annual Physical.     Lang DASEN Mishika Flippen, PA-C

## 2023-12-30 NOTE — Patient Instructions (Signed)
 It was nice to see you today!  As we discussed in clinic:  A type of therapy known as CBT-I is considered the first-line treatment for insomnia. It may even be able to help 4/5 individuals get better sleep. At this time I am unaware of any institutions offer CBT-I within our area.  There are some online therapy use which can be both effective for sleep and cost effective as well.  Go! To Sleep- Web-based App-this is a Energy manager therapy platform created by [facility] clinic for patients with insomnia.  Last time checked, the cost was around $40 (pretty cheap for better sleep!).  CBT-I Coach- iOS/Android App- This app provides education about sleep and CBT-I, personalized feedback about your sleep, and an option for sleep diary to track wake and sleep times.  Sleepio-  iOS/Android App and Web-based App- A fairly popular option, may be worth a try!  [facility]- (text based)- this option doesn't provide with audio or with an app but likely has some helpful information for you!  Magnesium Glycinate 400 mg nighlty- 30 minutes before bed time  If you have any problems before your next visit feel free to message me via MyChart (minor issues or questions) or call the office, otherwise you may reach out to schedule an office visit.  Thank you! Gorge Almanza, PA-C

## 2023-12-31 ENCOUNTER — Ambulatory Visit (HOSPITAL_BASED_OUTPATIENT_CLINIC_OR_DEPARTMENT_OTHER): Payer: Self-pay | Admitting: Student

## 2023-12-31 ENCOUNTER — Encounter (HOSPITAL_BASED_OUTPATIENT_CLINIC_OR_DEPARTMENT_OTHER): Payer: Self-pay | Admitting: Student

## 2023-12-31 LAB — CBC WITH DIFFERENTIAL/PLATELET
Basophils Absolute: 0 x10E3/uL (ref 0.0–0.2)
Basos: 1 %
EOS (ABSOLUTE): 0.2 x10E3/uL (ref 0.0–0.4)
Eos: 3 %
Hematocrit: 52.1 % — ABNORMAL HIGH (ref 37.5–51.0)
Hemoglobin: 17.5 g/dL (ref 13.0–17.7)
Immature Grans (Abs): 0 x10E3/uL (ref 0.0–0.1)
Immature Granulocytes: 0 %
Lymphocytes Absolute: 1.8 x10E3/uL (ref 0.7–3.1)
Lymphs: 31 %
MCH: 30.6 pg (ref 26.6–33.0)
MCHC: 33.6 g/dL (ref 31.5–35.7)
MCV: 91 fL (ref 79–97)
Monocytes Absolute: 0.6 x10E3/uL (ref 0.1–0.9)
Monocytes: 10 %
Neutrophils Absolute: 3.3 x10E3/uL (ref 1.4–7.0)
Neutrophils: 55 %
Platelets: 348 x10E3/uL (ref 150–450)
RBC: 5.72 x10E6/uL (ref 4.14–5.80)
RDW: 13.2 % (ref 11.6–15.4)
WBC: 5.9 x10E3/uL (ref 3.4–10.8)

## 2023-12-31 LAB — COMPREHENSIVE METABOLIC PANEL WITH GFR
ALT: 45 IU/L — ABNORMAL HIGH (ref 0–44)
AST: 37 IU/L (ref 0–40)
Albumin: 5 g/dL (ref 4.3–5.2)
Alkaline Phosphatase: 48 IU/L (ref 47–123)
BUN/Creatinine Ratio: 17 (ref 9–20)
BUN: 15 mg/dL (ref 6–20)
Bilirubin Total: 0.8 mg/dL (ref 0.0–1.2)
CO2: 23 mmol/L (ref 20–29)
Calcium: 10.3 mg/dL — ABNORMAL HIGH (ref 8.7–10.2)
Chloride: 99 mmol/L (ref 96–106)
Creatinine, Ser: 0.88 mg/dL (ref 0.76–1.27)
Globulin, Total: 2.3 g/dL (ref 1.5–4.5)
Glucose: 85 mg/dL (ref 70–99)
Potassium: 4.4 mmol/L (ref 3.5–5.2)
Sodium: 138 mmol/L (ref 134–144)
Total Protein: 7.3 g/dL (ref 6.0–8.5)
eGFR: 123 mL/min/1.73 (ref >59)

## 2023-12-31 LAB — HEMOGLOBIN A1C
Est. average glucose Bld gHb Est-mCnc: 108 mg/dL
Hgb A1c MFr Bld: 5.4 % (ref 4.8–5.6)

## 2023-12-31 LAB — LIPID PANEL
Chol/HDL Ratio: 3.1 ratio (ref 0.0–5.0)
Cholesterol, Total: 164 mg/dL (ref 100–199)
HDL: 53 mg/dL (ref >39)
LDL Chol Calc (NIH): 97 mg/dL (ref 0–99)
Triglycerides: 73 mg/dL (ref 0–149)
VLDL Cholesterol Cal: 14 mg/dL (ref 5–40)

## 2024-02-10 ENCOUNTER — Encounter (HOSPITAL_BASED_OUTPATIENT_CLINIC_OR_DEPARTMENT_OTHER): Payer: Self-pay | Admitting: Student

## 2024-02-24 ENCOUNTER — Other Ambulatory Visit (HOSPITAL_BASED_OUTPATIENT_CLINIC_OR_DEPARTMENT_OTHER): Payer: Self-pay

## 2024-02-24 ENCOUNTER — Ambulatory Visit: Admitting: Podiatry

## 2024-02-24 ENCOUNTER — Ambulatory Visit

## 2024-02-24 ENCOUNTER — Encounter: Payer: Self-pay | Admitting: Podiatry

## 2024-02-24 DIAGNOSIS — Q66221 Congenital metatarsus adductus, right foot: Secondary | ICD-10-CM | POA: Diagnosis not present

## 2024-02-24 DIAGNOSIS — M7751 Other enthesopathy of right foot: Secondary | ICD-10-CM

## 2024-02-24 DIAGNOSIS — B353 Tinea pedis: Secondary | ICD-10-CM | POA: Diagnosis not present

## 2024-02-24 DIAGNOSIS — Q66222 Congenital metatarsus adductus, left foot: Secondary | ICD-10-CM | POA: Diagnosis not present

## 2024-02-24 DIAGNOSIS — M778 Other enthesopathies, not elsewhere classified: Secondary | ICD-10-CM

## 2024-02-24 MED ORDER — KETOCONAZOLE 2 % EX CREA
1.0000 | TOPICAL_CREAM | Freq: Every day | CUTANEOUS | 5 refills | Status: AC
  Filled 2024-02-24: qty 30, 30d supply, fill #0

## 2024-02-24 NOTE — Progress Notes (Signed)
 Subjective:  Patient ID: Jonathon Kramer, male    DOB: [birth year],  MRN: [id number]  Chief Complaint  Patient presents with   Foot Pain    NP with Family HX of PAD and neuropathy. He would like a total foot exam to make sure its not neuropathy and to determine what is causing the pain the right 5th toe. There is some swelling present. There some pain in the left as well. He also has some pain across the tops of the toes.  Not diabetic no anti coag.    Discussed the use of AI scribe software for clinical note transcription with the patient, who gave verbal consent to proceed.  History of Present Illness Jonathon Kramer is a 23 year old male who presents with foot pain and numbness. He is accompanied by his mother.  He has had right foot symptoms for about two months, starting after a new job that requires prolonged standing. He first noticed tingling in the right toes, especially the fifth toe, which progressed to numbness, swelling, and sharp throbbing pain after work. The pain has decreased and now feels like mild pressure with persistent numbness to the digits of the right foot.  Symptoms, initially limited to the right foot, now also involve the left foot, mainly numbness in the fourth toe. He connects onset to wearing inappropriate shoes on the first workday and has since tried more supportive footwear, though none feel fully comfortable.  His father has peripheral artery disease and neuropathy, which concerns him. He denies weight loss, back pain, or prior ankle injuries. His toes sometimes curl under in certain shoes but he can straighten them. Pain resolves when he is off his feet, though a pressure sensation can last for a few hours after work. He has not used medications for this pain.      Objective:    Physical Exam EXTREMITIES: Pulses palpable bilaterally in feet. MUSCULOSKELETAL: Good muscle strength in feet. Good ankle joint dorsiflexion. Right foot abducts more than left foot  during walking.  Localized edema right fifth toe with slight adductovarus rotation. NEUROLOGICAL: Intact protective sensation and vibratory sensation.  Subjective decreased light touch sensation to the distal toes right foot SKIN: Athlete's foot more on left foot than right with xerotic skin and erythematous rash plantar aspect   No images are attached to the encounter.    Results RADIOLOGY Right foot foot X-ray 3 views weightbearing 02/24/2024: Normal osseous mineralization.  Slight met adductus foot type.  High arch foot type noted.  Adductovarus rotation of fifth toe.  No acute fractures.   Assessment:   1. Capsulitis of toe of right foot   2. Tinea pedis of left foot   3. Metatarsus adductus of both feet      Plan:  Patient was evaluated and treated and all questions answered.  Assessment and Plan Assessment & Plan Capsulitis of right fifth toe pain and swelling Intermittent pain and swelling in the right fifth toe, possibly due to shoe fit and foot structure. - Wear shoes with a wider toe box and softer material. - Use a protective sleeve or corn pad for the fifth toe. - Consider anti-inflammatory medications like ibuprofen or Aleve. - Wear compression stockings. - Elevate feet after work.  Bilateral metatarsus adductus Inward angling of metatarsal bones contributing to discomfort. - Wear supportive shoes such as [employer], Asics, or New Balance. - Consider over-the-counter inserts like Power Steps or Super Feet. - Visit a shoe store for proper fitting.  Tinea pedis (athlete's foot), left foot predominant Fungal infection on the left foot with redness and dry skin. - Apply ketoconazole  1% cream to affected areas one to two times daily.  Prescription sent to patient's pharmacy - Deep clean areas where feet are barefoot using bleach cleaner. - Change socks throughout the day, preferably white socks, and treat shoes with antifungal spray or powder.  Bilateral foot  numbness and tingling Intermittent numbness and tingling, possibly related to exertion and foot structure. Neuropathy screening negative. - Maintain activity levels to promote circulation. - Wear compression stockings. - Consider vitamin B12 testing. Previously has been ordered by pcp but has not been obtained. - Wear supportive shoes and consider over-the-counter inserts. - Monitor symptoms and report any worsening.      Return if symptoms worsen or fail to improve.

## 2024-02-25 ENCOUNTER — Other Ambulatory Visit (HOSPITAL_BASED_OUTPATIENT_CLINIC_OR_DEPARTMENT_OTHER): Payer: Self-pay
